# Patient Record
Sex: Male | Born: 1980 | Race: Black or African American | Hispanic: No | Marital: Single | Smoking: Never smoker
Health system: Southern US, Community
[De-identification: ages and names within clinical notes are randomized; demographics above are authoritative.]

## PROBLEM LIST (undated history)

## (undated) HISTORY — PX: INNER EAR SURGERY: SHX679

---

## 2005-04-25 ENCOUNTER — Emergency Department: Payer: Self-pay | Admitting: Emergency Medicine

## 2005-05-02 ENCOUNTER — Emergency Department: Payer: Self-pay | Admitting: Unknown Physician Specialty

## 2006-05-05 ENCOUNTER — Emergency Department: Payer: Self-pay | Admitting: Emergency Medicine

## 2006-05-18 ENCOUNTER — Emergency Department: Payer: Self-pay | Admitting: Emergency Medicine

## 2007-12-06 ENCOUNTER — Emergency Department: Payer: Self-pay | Admitting: Emergency Medicine

## 2008-02-01 ENCOUNTER — Emergency Department: Payer: Self-pay | Admitting: Emergency Medicine

## 2008-02-01 ENCOUNTER — Other Ambulatory Visit: Payer: Self-pay

## 2013-04-20 ENCOUNTER — Ambulatory Visit: Payer: Self-pay | Admitting: Otolaryngology

## 2014-09-22 NOTE — Op Note (Signed)
PATIENT NAME:  Tyrone Price, Tyrone Price MR#:  [id number] DATE OF BIRTH:  1980/10/16  DATE OF PROCEDURE:  04/20/2013  PREOPERATIVE DIAGNOSIS: Left tympanic membrane perforation.   POSTOPERATIVE DIAGNOSIS: Left tympanic membrane perforation.  PROCEDURE: Left tympanoplasty.   SURGEON: Marion DownerScott Kamisha Ell, MD.   ANESTHESIA: General endotracheal.   INDICATIONS: The patient with Price history of anterior left TM perforation, approximately 30% of the TM.   FINDINGS: There was Price 30% anterior perforation with clean margins.   COMPLICATIONS: None.   DESCRIPTION OF PROCEDURE: After obtaining informed consent, the patient was taken to the operating room and placed in the supine position. After induction of general endotracheal anesthesia, the patient was turned 90 degrees and the left ear was evaluated under the operating microscope and injected with 1% lidocaine with epinephrine 1:200,000 in the canal skin as well as injections in the postauricular crease. The left ear was then prepped and draped in the usual sterile fashion. The ear was evaluated under the operating microscope. Canal incisions were made at 12:00 and 6:00 vertically followed by Price horizontal canal incision 4 mm behind the annulus. Price postauricular incision was then made and carried down to the mastoid cortex. The temporalis fascia was dissected out and harvested as Price graft. This was set aside to be pressed and dried. Price Lempert was used to elevate the periosteum from the mastoid cortex, elevating down into the posterior ear canal. Price freer elevator was used to further elevate the posterior canal wall skin flap down to the previously made canal cuts. The ear was then retracted forward with Price Penrose drain and self retainer. Price weapon was used to elevate the tympanomeatal flap down to the annulus after which Price Rosen needle was used to carefully elevate the annulus and enter the middle ear. The chorda tympani nerve was identified and carefully preserved as the  tympanomeatal flap was elevated. The perforation was anterior. The margin of the perforation was carefully debrided using Price pick and baby weapon removing the epithelial edge circumferentially.   The middle ear was then irrigated and suctioned and the temporalis fascia graft was trimmed with Price notch superiorly for the malleus and then placed into the middle ear. The middle ear was then packed with Gelfoam to support the graft against the tympanic membrane remnant. Care was taken to make sure the tympanic membrane perforation was completely covered by the medially placed graft at all edges. The graft and tympanomeatal flap were placed into position and Gelfoam packed lateral to the reconstructed tympanic membrane. The posterior canal flap was everted and then carefully placed into anatomic position and the canal further packed with Gelfoam, which was moistened with sulfacetamide drops. The postauricular incision was closed with 4-0 Vicryl sutures in an interrupted fashion followed by 5-0 fast absorbing gut suture and Price running lock stitch for the skin. Bacitracin ointment was applied on the wound as well as in the canal followed by placement of Price Glasscock dressing. The patient was then returned to the anesthesiologist for awakening. He was awakened and taken to the recovery room in good condition postoperatively. Blood loss was less than 5 mL.   ____________________________ Ollen GrossPaul S. Willeen CassBennett, MD psb:aw D: 04/20/2013 13:30:52 ET T: 04/20/2013 14:24:57 ET JOB#: [id number]  cc: Ollen GrossPaul S. Willeen CassBennett, MD, <Dictator> Sandi MealyPAUL S Lawrie Tunks MD ELECTRONICALLY SIGNED 04/26/2013 12:00

## 2015-08-04 ENCOUNTER — Encounter: Payer: Self-pay | Admitting: Gynecology

## 2015-08-04 ENCOUNTER — Ambulatory Visit
Admission: EM | Admit: 2015-08-04 | Discharge: 2015-08-04 | Disposition: A | Discharge status: Home / Self Care | Payer: BLUE CROSS/BLUE SHIELD | Attending: Family Medicine | Admitting: Family Medicine

## 2015-08-04 DIAGNOSIS — J0100 Acute maxillary sinusitis, unspecified: Secondary | ICD-10-CM | POA: Diagnosis not present

## 2015-08-04 DIAGNOSIS — J101 Influenza due to other identified influenza virus with other respiratory manifestations: Secondary | ICD-10-CM | POA: Diagnosis not present

## 2015-08-04 LAB — RAPID INFLUENZA A&B ANTIGENS
Influenza A (ARMC): NEGATIVE
Influenza B (ARMC): POSITIVE — AB

## 2015-08-04 LAB — RAPID STREP SCREEN (MED CTR MEBANE ONLY): Streptococcus, Group A Screen (Direct): NEGATIVE

## 2015-08-04 MED ORDER — FLUTICASONE PROPIONATE 50 MCG/ACT NA SUSP: 2.0000 | Freq: Every day | NASAL | Status: DC

## 2015-08-04 MED ORDER — AZITHROMYCIN 250 MG PO TABS: ORAL_TABLET | ORAL | Status: DC

## 2015-08-04 MED ORDER — OSELTAMIVIR PHOSPHATE 75 MG PO CAPS: 75.0000 mg | ORAL_CAPSULE | Freq: Two times a day (BID) | ORAL | Status: DC

## 2015-08-04 MED ORDER — HYDROCOD POLST-CPM POLST ER 10-8 MG/5ML PO SUER: 5.0000 mL | Freq: Two times a day (BID) | ORAL | Status: DC

## 2015-08-04 NOTE — ED Notes (Signed)
Patient c/o x 1 week coughing nasal congestion / head ace and sore throat.

## 2015-08-04 NOTE — Discharge Instructions (Signed)
Cool Mist Vaporizers Vaporizers may help relieve the symptoms of a cough and cold. They add moisture to the air, which helps mucus to become thinner and less sticky. This makes it easier to breathe and cough up secretions. Cool mist vaporizers do not cause serious burns like hot mist vaporizers, which may also be called steamers or humidifiers. Vaporizers have not been proven to help with colds. You should not use a vaporizer if you are allergic to mold. HOME CARE INSTRUCTIONS  Follow the package instructions for the vaporizer.  Do not use anything other than distilled water in the vaporizer.  Do not run the vaporizer all of the time. This can cause mold or bacteria to grow in the vaporizer.  Clean the vaporizer after each time it is used.  Clean and dry the vaporizer well before storing it.  Stop using the vaporizer if worsening respiratory symptoms develop.   This information is not intended to replace advice given to you by your health care provider. Make sure you discuss any questions you have with your health care provider.   Document Released: 02/14/2004 Document Revised: 05/24/2013 Document Reviewed: 10/06/2012 Elsevier Interactive Patient Education 2016 Else[employer] Inc.  Influenza, Adult Influenza ("the flu") is a viral infection of the respiratory tract. It occurs more often in winter months because people spend more time in close contact with one another. Influenza can make you feel very sick. Influenza easily spreads from person to person (contagious). CAUSES  Influenza is caused by a virus that infects the respiratory tract. You can catch the virus by breathing in droplets from an infected person's cough or sneeze. You can also catch the virus by touching something that was recently contaminated with the virus and then touching your mouth, nose, or eyes. RISKS AND COMPLICATIONS You may be at risk for a more severe case of influenza if you smoke cigarettes, have diabetes, have  chronic heart disease (such as heart failure) or lung disease (such as asthma), or if you have a weakened immune system. Elderly people and pregnant women are also at risk for more serious infections. The most common problem of influenza is a lung infection (pneumonia). Sometimes, this problem can require emergency medical care and may be life threatening. SIGNS AND SYMPTOMS  Symptoms typically last 4 to 10 days and may include:  Fever.  Chills.  Headache, body aches, and muscle aches.  Sore throat.  Chest discomfort and cough.  Poor appetite.  Weakness or feeling tired.  Dizziness.  Nausea or vomiting. DIAGNOSIS  Diagnosis of influenza is often made based on your history and a physical exam. A nose or throat swab test can be done to confirm the diagnosis. TREATMENT  In mild cases, influenza goes away on its own. Treatment is directed at relieving symptoms. For more severe cases, your health care provider may prescribe antiviral medicines to shorten the sickness. Antibiotic medicines are not effective because the infection is caused by a virus, not by bacteria. HOME CARE INSTRUCTIONS  Take medicines only as directed by your health care provider.  Use a cool mist humidifier to make breathing easier.  Get plenty of rest until your temperature returns to normal. This usually takes 3 to 4 days.  Drink enough fluid to keep your urine clear or pale yellow.  Cover yourmouth and nosewhen coughing or sneezing,and wash your handswellto prevent thevirusfrom spreading.  Stay homefromwork orschool untilthe fever is gonefor at least 60full day. PREVENTION  An annual influenza vaccination (flu shot) is the best  way to avoid getting influenza. An annual flu shot is now routinely recommended for all adults in the [place] SEEK MEDICAL CARE IF:  You experiencechest pain, yourcough worsens,or you producemore mucus.  Youhave nausea,vomiting, ordiarrhea.  Your fever returns or  gets worse. SEEK IMMEDIATE MEDICAL CARE IF:  You havetrouble breathing, you become short of breath,or your skin ornails becomebluish.  You have severe painor stiffnessin the neck.  You develop a sudden headache, or pain in the face or ear.  You have nausea or vomiting that you cannot control. MAKE SURE YOU:   Understand these instructions.  Will watch your condition.  Will get help right away if you are not doing well or get worse.   This information is not intended to replace advice given to you by your health care provider. Make sure you discuss any questions you have with your health care provider.   Document Released: 05/16/2000 Document Revised: 06/09/2014 Document Reviewed: 08/18/2011 Elsevier Interactive Patient Education 2016 Elsevier Inc.  Sinusitis, Adult Sinusitis is redness, soreness, and inflammation of the paranasal sinuses. Paranasal sinuses are air pockets within the bones of your face. They are located beneath your eyes, in the middle of your forehead, and above your eyes. In healthy paranasal sinuses, mucus is able to drain out, and air is able to circulate through them by way of your nose. However, when your paranasal sinuses are inflamed, mucus and air can become trapped. This can allow bacteria and other germs to grow and cause infection. Sinusitis can develop quickly and last only a short time (acute) or continue over a long period (chronic). Sinusitis that lasts for more than 12 weeks is considered chronic. CAUSES Causes of sinusitis include:  Allergies.  Structural abnormalities, such as displacement of the cartilage that separates your nostrils (deviated septum), which can decrease the air flow through your nose and sinuses and affect sinus drainage.  Functional abnormalities, such as when the small hairs (cilia) that line your sinuses and help remove mucus do not work properly or are not present. SIGNS AND SYMPTOMS Symptoms of acute and chronic  sinusitis are the same. The primary symptoms are pain and pressure around the affected sinuses. Other symptoms include:  Upper toothache.  Earache.  Headache.  Bad breath.  Decreased sense of smell and taste.  A cough, which worsens when you are lying flat.  Fatigue.  Fever.  Thick drainage from your nose, which often is green and may contain pus (purulent).  Swelling and warmth over the affected sinuses. DIAGNOSIS Your health care provider will perform a physical exam. During your exam, your health care provider may perform any of the following to help determine if you have acute sinusitis or chronic sinusitis:  Look in your nose for signs of abnormal growths in your nostrils (nasal polyps).  Tap over the affected sinus to check for signs of infection.  View the inside of your sinuses using an imaging device that has a light attached (endoscope). If your health care provider suspects that you have chronic sinusitis, one or more of the following tests may be recommended:  Allergy tests.  Nasal culture. A sample of mucus is taken from your nose, sent to a lab, and screened for bacteria.  Nasal cytology. A sample of mucus is taken from your nose and examined by your health care provider to determine if your sinusitis is related to an allergy. TREATMENT Most cases of acute sinusitis are related to a viral infection and will resolve on their own  within 10 days. Sometimes, medicines are prescribed to help relieve symptoms of both acute and chronic sinusitis. These may include pain medicines, decongestants, nasal steroid sprays, or saline sprays. However, for sinusitis related to a bacterial infection, your health care provider will prescribe antibiotic medicines. These are medicines that will help kill the bacteria causing the infection. Rarely, sinusitis is caused by a fungal infection. In these cases, your health care provider will prescribe antifungal medicine. For some cases of  chronic sinusitis, surgery is needed. Generally, these are cases in which sinusitis recurs more than 3 times per year, despite other treatments. HOME CARE INSTRUCTIONS  Drink plenty of water. Water helps thin the mucus so your sinuses can drain more easily.  Use a humidifier.  Inhale steam 3-4 times a day (for example, sit in the bathroom with the shower running).  Apply a warm, moist washcloth to your face 3-4 times a day, or as directed by your health care provider.  Use saline nasal sprays to help moisten and clean your sinuses.  Take medicines only as directed by your health care provider.  If you were prescribed either an antibiotic or antifungal medicine, finish it all even if you start to feel better. SEEK IMMEDIATE MEDICAL CARE IF:  You have increasing pain or severe headaches.  You have nausea, vomiting, or drowsiness.  You have swelling around your face.  You have vision problems.  You have a stiff neck.  You have difficulty breathing.   This information is not intended to replace advice given to you by your health care provider. Make sure you discuss any questions you have with your health care provider.   Document Released: 05/19/2005 Document Revised: 06/09/2014 Document Reviewed: 06/03/2011 Elsevier Interactive Patient Education [employer] Elsevier Inc.

## 2015-08-04 NOTE — ED Provider Notes (Signed)
CSN: [id number]     Arrival date & time 08/04/15  1451 History   First MD Initiated Contact with Patient 08/04/15 1521     Chief Complaint  Patient presents with  . URI   (Consider location/radiation/quality/duration/timing/severity/associated sxs/prior Treatment) HPI  This a 35 year old male who presents with a one-week history of coughing and nasal can do along with headache and sore throat. He states that for the last couple days it has been worsening. His headache is mostly in his frontal and maxillary sinuses. He has had some yellow mucus discharge from his nose and has been coughing but that tends to swallow the sputum up. His girlfriend who accompanies him states that today he does cough all night keeping her awake. He has some nasal congestion along with the runny nose. He has had the chills and is febrile today at 99 pulse of 89 respirations 18 blood pressure 111/69 O2 sat 98% on room air  History reviewed. No pertinent past medical history. Past Surgical History  Procedure Laterality Date  . Inner ear surgery Left    Family History  Problem Relation Age of Onset  . Hypertension Mother   . Diabetes Father    Social History  Substance Use Topics  . Smoking status: Former Games developer  . Smokeless tobacco: Never Used  . Alcohol Use: No    Review of Systems  Constitutional: Positive for fever, chills, activity change and fatigue.  HENT: Positive for congestion, postnasal drip, rhinorrhea, sinus pressure and sneezing.   Respiratory: Positive for cough. Negative for shortness of breath, wheezing and stridor.   All other systems reviewed and are negative.   Allergies  Ciprofloxacin  Home Medications   Prior to Admission medications   Medication Sig Start Date End Date Taking? Authorizing Provider  azithromycin (ZITHROMAX Z-PAK) 250 MG tablet Use as per package instructions 08/04/15   Lutricia Feil, PA-C  chlorpheniramine-HYDROcodone [facility]) 10-8 MG/5ML SUER  Take 5 mLs by mouth 2 (two) times daily. 08/04/15   Lutricia Feil, PA-C  fluticasone (FLONASE) 50 MCG/ACT nasal spray Place 2 sprays into both nostrils daily. 08/04/15   Lutricia Feil, PA-C  oseltamivir (TAMIFLU) 75 MG capsule Take 1 capsule (75 mg total) by mouth every 12 (twelve) hours. 08/04/15   Lutricia Feil, PA-C   Meds Ordered and Administered this Visit  Medications - No data to display  BP 111/69 mmHg  Pulse 89  Temp(Src) 99 F (37.2 C) (Oral)  Resp 18  Ht 6' (1.829 m)  Wt 206 lb (93.441 kg)  BMI 27.93 kg/m2  SpO2 98% No data found.   Physical Exam  Constitutional: He is oriented to person, place, and time. He appears well-developed and well-nourished. No distress.  HENT:  Head: Normocephalic and atraumatic.  Right Ear: External ear normal.  Left Ear: External ear normal.  Nose: Nose normal.  Mouth/Throat: Oropharynx is clear and moist. No oropharyngeal exudate.  Eyes: Conjunctivae are normal. Pupils are equal, round, and reactive to light. Right eye exhibits no discharge. Left eye exhibits no discharge.  Neck: Normal range of motion. Neck supple.  Pulmonary/Chest: Effort normal and breath sounds normal. No respiratory distress. He has no wheezes. He has no rales.  Musculoskeletal: Normal range of motion. He exhibits no edema or tenderness.  Lymphadenopathy:    He has no cervical adenopathy.  Neurological: He is alert and oriented to person, place, and time.  Skin: Skin is warm and dry. No rash noted. He is not diaphoretic.  Psychiatric: He has a normal mood and affect. His behavior is normal. Judgment and thought content normal.  Nursing note and vitals reviewed.   ED Course  Procedures (including critical care time)  Labs Review Labs Reviewed  RAPID INFLUENZA A&B ANTIGENS ([facility] ONLY) - Abnormal; Notable for the following:    Influenza B ([facility]) POSITIVE (*)    All other components within normal limits  RAPID STRE[facility]EN [facility])  CULTURE, GROUP A  STREP [facility]    Imaging Review No results found.   Visual Acuity Review  Right Eye Distance:   Left Eye Distance:   Bilateral Distance:    Right Eye Near:   Left Eye Near:    Bilateral Near:         MDM   1. Influenza B   2. Acute maxillary sinusitis, recurrence not specified    New Prescriptions   AZITHROMYCIN (ZITHROMAX Z-PAK) 250 MG TABLET    Use as per package instructions   CHLORPHENIRAMINE-HYDROCODONE (TUSSIONEX PENNKINETIC ER) 10-8 MG/5ML SUER    Take 5 mLs by mouth 2 (two) times daily.   FLUTICASONE (FLONASE) 50 MCG/ACT NASAL SPRAY    Place 2 sprays into both nostrils daily.   OSELTAMIVIR (TAMIFLU) 75 MG CAPSULE    Take 1 capsule (75 mg total) by mouth every 12 (twelve) hours.  Plan: 1. Test/x-ray results and diagnosis reviewed with patient 2. rx as per orders; risks, benefits, potential side effects reviewed with patient 3. Recommend supportive treatment with Tamiflu if it isn't too expensive on his insurance plan. Told him it may help him feel better quicker and decrease the amount of time without it he would do just S course as is. He also likely has a sinusitis that is developing and INR but told to hold onto Z-Pak and use if he continues to have his symptoms for 1 week more. I will keep him out of work through Tuesday any return on Tuesday if his fever is broken by then. He should follow-up with a primary care if he continues to have problems worsens  Lutricia FeilWilliam P Roemer, PA-C 08/04/15 1547

## 2015-08-06 LAB — CULTURE, GROUP A STREP (THRC)

## 2016-02-12 ENCOUNTER — Emergency Department: Payer: BLUE CROSS/BLUE SHIELD

## 2016-02-12 ENCOUNTER — Encounter: Payer: Self-pay | Admitting: Emergency Medicine

## 2016-02-12 ENCOUNTER — Emergency Department
Admission: EM | Admit: 2016-02-12 | Discharge: 2016-02-12 | Disposition: A | Discharge status: Home / Self Care | Payer: BLUE CROSS/BLUE SHIELD | Attending: Emergency Medicine | Admitting: Emergency Medicine

## 2016-02-12 DIAGNOSIS — R0789 Other chest pain: Secondary | ICD-10-CM | POA: Diagnosis not present

## 2016-02-12 DIAGNOSIS — Z87891 Personal history of nicotine dependence: Secondary | ICD-10-CM | POA: Insufficient documentation

## 2016-02-12 DIAGNOSIS — Z792 Long term (current) use of antibiotics: Secondary | ICD-10-CM | POA: Insufficient documentation

## 2016-02-12 DIAGNOSIS — R079 Chest pain, unspecified: Secondary | ICD-10-CM

## 2016-02-12 LAB — TROPONIN I: Troponin I: <0.03 ng/mL (ref <0.03)

## 2016-02-12 LAB — CBC
HEMATOCRIT: 43.6 % (ref 40.0–52.0)
HEMOGLOBIN: 14.8 g/dL (ref 13.0–18.0)
MCH: 29.7 pg (ref 26.0–34.0)
MCHC: 33.9 g/dL (ref 32.0–36.0)
MCV: 87.7 fL (ref 80.0–100.0)
Platelets: 210 10*3/uL (ref 150–440)
RBC: 4.97 MIL/uL (ref 4.40–5.90)
RDW: 12.8 % (ref 11.5–14.5)
WBC: 5.6 10*3/uL (ref 3.8–10.6)

## 2016-02-12 LAB — BASIC METABOLIC PANEL
ANION GAP: 6 (ref 5–15)
BUN: 11 mg/dL (ref 6–20)
CHLORIDE: 103 mmol/L (ref 101–111)
CO2: 28 mmol/L (ref 22–32)
Calcium: 9.5 mg/dL (ref 8.9–10.3)
Creatinine, Ser: 0.99 mg/dL (ref 0.61–1.24)
GFR calc Af Amer: >60 mL/min (ref >60)
GFR calc non Af Amer: >60 mL/min (ref >60)
Glucose, Bld: 89 mg/dL (ref 65–99)
POTASSIUM: 4.1 mmol/L (ref 3.5–5.1)
SODIUM: 137 mmol/L (ref 135–145)

## 2016-02-12 MED ORDER — GI COCKTAIL ~~LOC~~
30.0000 mL | Freq: Once | ORAL | Status: AC
  Administered 2016-02-12: 30 mL via ORAL
  Filled 2016-02-12: qty 30

## 2016-02-12 MED ORDER — PANTOPRAZOLE SODIUM 20 MG PO TBEC: 20.0000 mg | DELAYED_RELEASE_TABLET | Freq: Every day | ORAL | 1 refills | Status: DC

## 2016-02-12 NOTE — ED Triage Notes (Signed)
C/O chest pain x 1 week.  Describes pain as intermittent.  At times pain is to right chest, at times to center. States pain improves with ambulation

## 2016-02-12 NOTE — Discharge Instructions (Signed)
Please return immediately if condition worsens. Please contact her primary physician or the physician you were given for referral. If you have any specialist physicians involved in her treatment and plan please also contact them. Thank you for using [facility] [place] [facility]. ° °

## 2016-02-12 NOTE — ED Provider Notes (Signed)
Time Seen: Approximately 1754  I have reviewed the triage notes  Chief Complaint: No chief complaint on file.   History of Present Illness: Tyrone Price is a 35 y.o. male who presents with some intermittent chest discomfort now for the past week. Patient states he's pretty much had constant discomfort over the last 24 hours so will change in location. He states his previously been on the left side of his chest pain occasionally substernal and then sometimes toward the right upper chest region. He states the pain primarily started to the epigastric area which is where he points and he states he's had a lot of burping and belching and the pain seems to be worse with lying flat. He denies any persistent nausea or vomiting or shortness of breath. He denies any pleuritic or obvious positional component. He denies any leg pain or swelling and has no history of blood clots in the legs or lungs.  He denies any cardiac risk factors such as history diabetes, hypertension, high cholesterol. No family history of early cardiovascular disease History reviewed. No pertinent past medical history.  There are no active problems to display for this patient.   Past Surgical History:  Procedure Laterality Date  . INNER EAR SURGERY Left     Past Surgical History:  Procedure Laterality Date  . INNER EAR SURGERY Left     Current Outpatient Rx  . Order #: [id number] Class: Normal  . Order #: [id number] Class: Print  . Order #: [id number] Class: Normal  . Order #: [id number] Class: Print  . Order #: [id number] Class: Print    Allergies:  Ciprofloxacin  Family History: Family History  Problem Relation Age of Onset  . Hypertension Mother   . Diabetes Father     Social History: Social History  Substance Use Topics  . Smoking status: Former Games developermoker  . Smokeless tobacco: Never Used  . Alcohol use No     Review of Systems:   10 point review of systems was performed and was otherwise  negative:  Constitutional: No fever Eyes: No visual disturbances ENT: No sore throat, ear pain Cardiac: No chest pain Respiratory: No shortness of breath, wheezing, or stridor Abdomen: No abdominal pain, no vomiting, No diarrhea Endocrine: No weight loss, No night sweats Extremities: No peripheral edema, cyanosis Skin: No rashes, easy bruising Neurologic: No focal weakness, trouble with speech or swollowing Urologic: No dysuria, Hematuria, or urinary frequency   Physical Exam:  ED Triage Vitals  Enc Vitals Group     BP 02/12/16 1734 134/70     Pulse Rate 02/12/16 1734 70     Resp 02/12/16 1734 16     Temp 02/12/16 1734 98.6 F (37 C)     Temp Source 02/12/16 1734 Oral     SpO2 02/12/16 1734 98 %     Weight 02/12/16 1733 203 lb (92.1 kg)     Height 02/12/16 1733 6' (1.829 m)     Head Circumference --      Peak Flow --      Pain Score 02/12/16 1733 3     Pain Loc --      Pain Edu? --      Excl. in GC? --     General: Awake , Alert , and Oriented times 3; GCS 15 Head: Normal cephalic , atraumatic Eyes: Pupils equal , round, reactive to light Nose/Throat: No nasal drainage, patent upper airway without erythema or exudate.  Neck: Supple, Full range of motion, No anterior adenopathy or palpable  thyroid masses Lungs: Clear to ascultation without wheezes , rhonchi, or rales Heart: Regular rate, regular rhythm without murmurs , gallops , or rubs Abdomen: Soft, non tender without rebound, guarding , or rigidity; bowel sounds positive and symmetric in all 4 quadrants. No organomegaly .        Extremities: 2 plus symmetric pulses. No edema, clubbing or cyanosis Neurologic: normal ambulation, Motor symmetric without deficits, sensory intact Skin: warm, dry, no rashes   Labs:   All laboratory work was reviewed including any pertinent negatives or positives listed below:  Labs Reviewed  BASIC METABOLIC PANEL  CBC  TROPONIN I  Laboratory work was reviewed and showed no  clinically significant abnormalities.   EKG: ED ECG REPORT I, Jennye Moccasin, the attending physician, personally viewed and interpreted this ECG.  Date: 02/12/2016 EKG Time: 1737 Rate: *73 Rhythm: normal sinus rhythm QRS Axis: normal Intervals: normal ST/T Wave abnormalities: normal Conduction Disturbances: none Narrative Interpretation: unremarkable Early repolarization with J-point elevation, no acute ischemic changes are noted  Radiology:  "Dg Chest 2 View  Result Date: 02/12/2016 CLINICAL DATA:  Chest pain for 1 week. Intermittent pain, central and right sided. EXAM: CHEST  2 VIEW COMPARISON:  02/01/2008 FINDINGS: The cardiomediastinal contours are normal. The lungs are clear. Pulmonary vasculature is normal. No consolidation, pleural effusion, or pneumothorax. No acute osseous abnormalities are seen. IMPRESSION: No acute pulmonary process. Electronically Signed   By: Rubye Oaks M.D.   On: 02/12/2016 18:09  "  I personally reviewed the radiologic studies     ED Course:  Differential includes all life-threatening causes for chest pain. This includes but is not exclusive to acute coronary syndrome, aortic dissection, pulmonary embolism, cardiac tamponade, community-acquired pneumonia, pericarditis, musculoskeletal chest wall pain, etc.  Given the patient's current clinical presentation, was factors, etc. I felt this was unlikely to be acute coronary syndrome or other life-threatening causes for chest pain. GI cocktail with symptomatic improvement and given his descriptions of felt this is likely to be esophageal reflux discomfort. Clinical Course     Assessment: Acute unspecified chest pain   Final Clinical Impression  Final diagnoses:  Chest pain syndrome     Plan: Outpatient Patient was referred to [facility] family practice. " New Prescriptions   PANTOPRAZOLE (PROTONIX) 20 MG TABLET    Take 1 tablet (20 mg total) by mouth daily.  "  Patient was  advised to return immediately if condition worsens. Patient was advised to follow up with their primary care physician or other specialized physicians involved in their outpatient care. The patient and/or family member/power of attorney had laboratory results reviewed at the bedside. All questions and concerns were addressed and appropriate discharge instructions were distributed by the nursing staff.            Jennye Moccasin, MD 02/12/16 [phone]

## 2017-12-25 IMAGING — CR DG CHEST 2V
1 series · 2 of 2 positions shown · non-contrast
Comparison: 02/01/2008

CLINICAL DATA: Chest pain for 1 week. Intermittent pain, central
and right sided.

EXAM:
CHEST  2 VIEW

[Series 1: dg chest 2 view · 0.14mm/px · 2 of 2 slices shown]
[im 1/2]
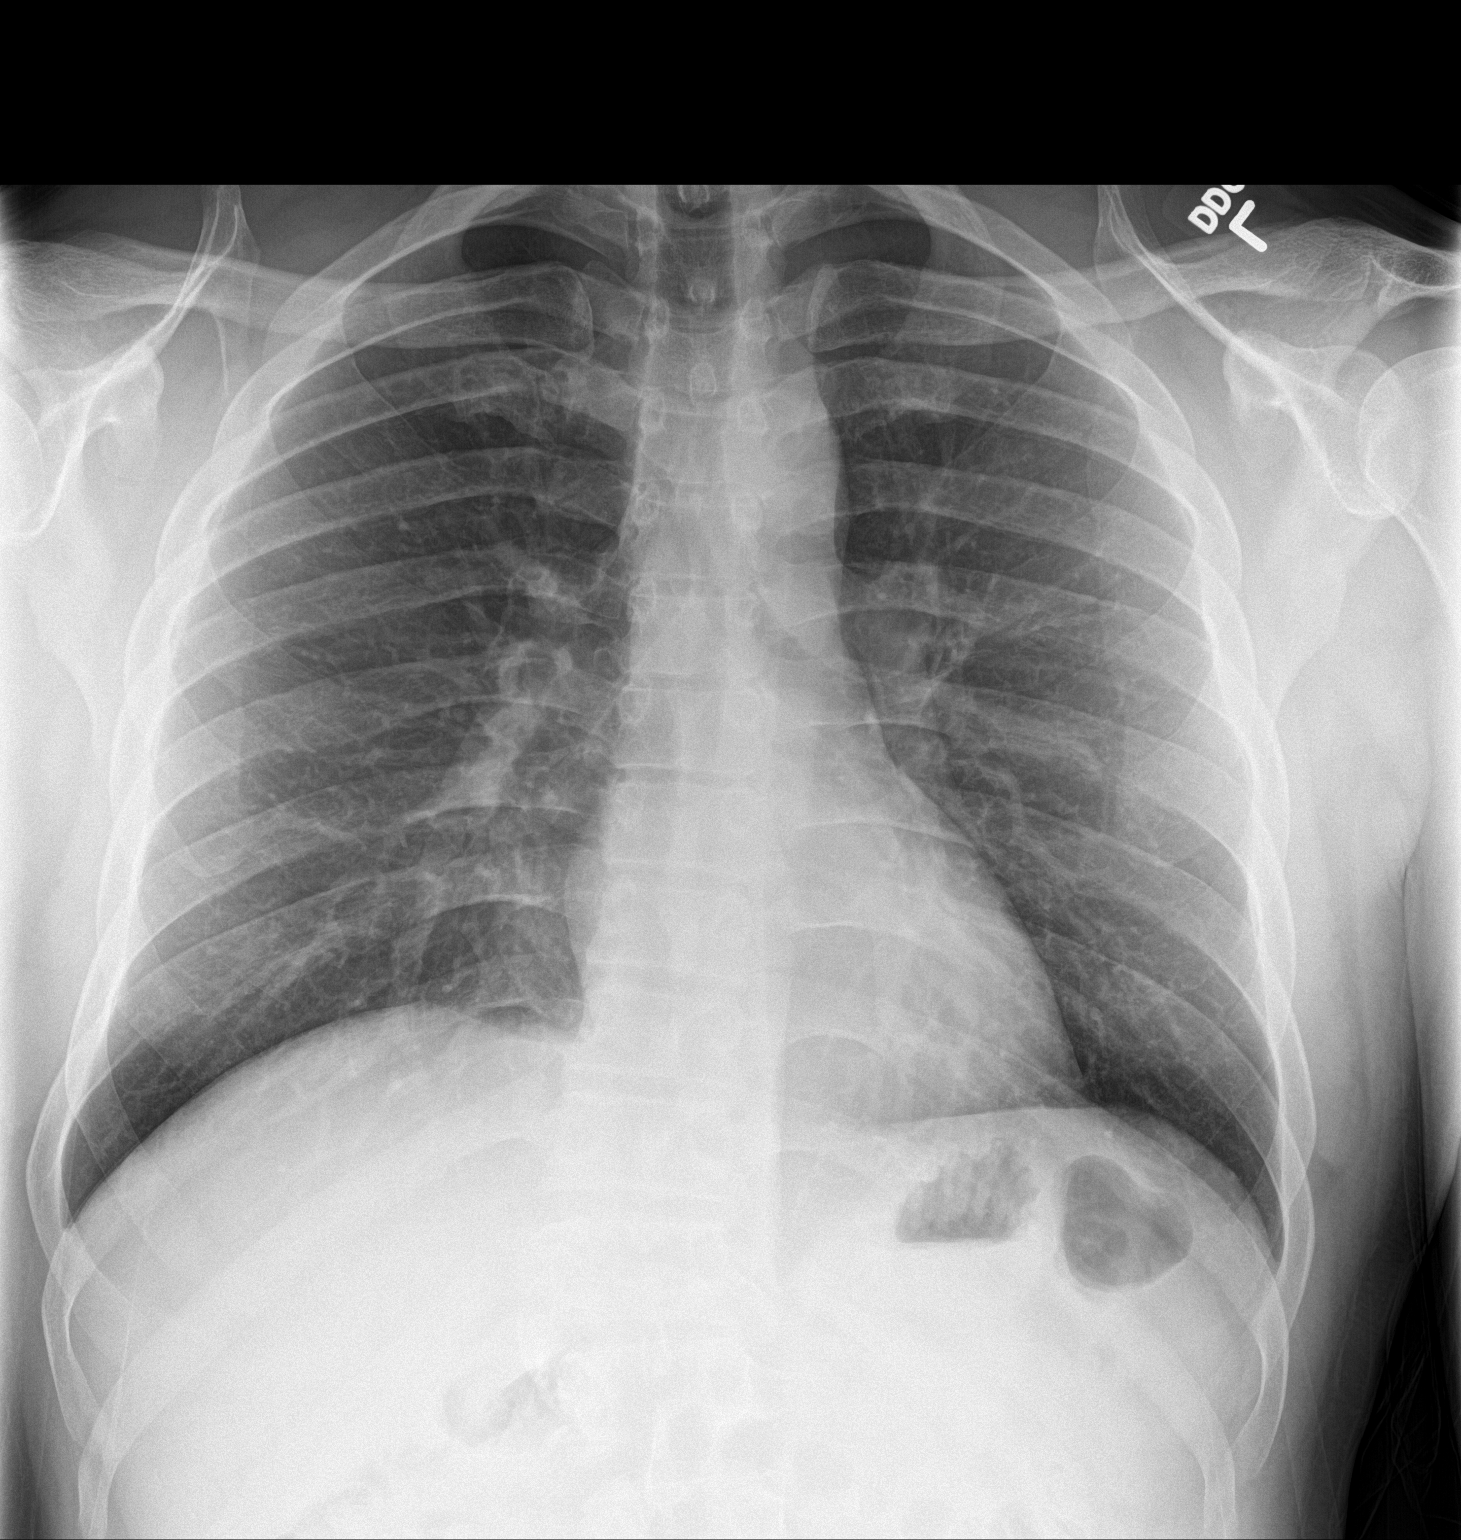
[im 2/2]
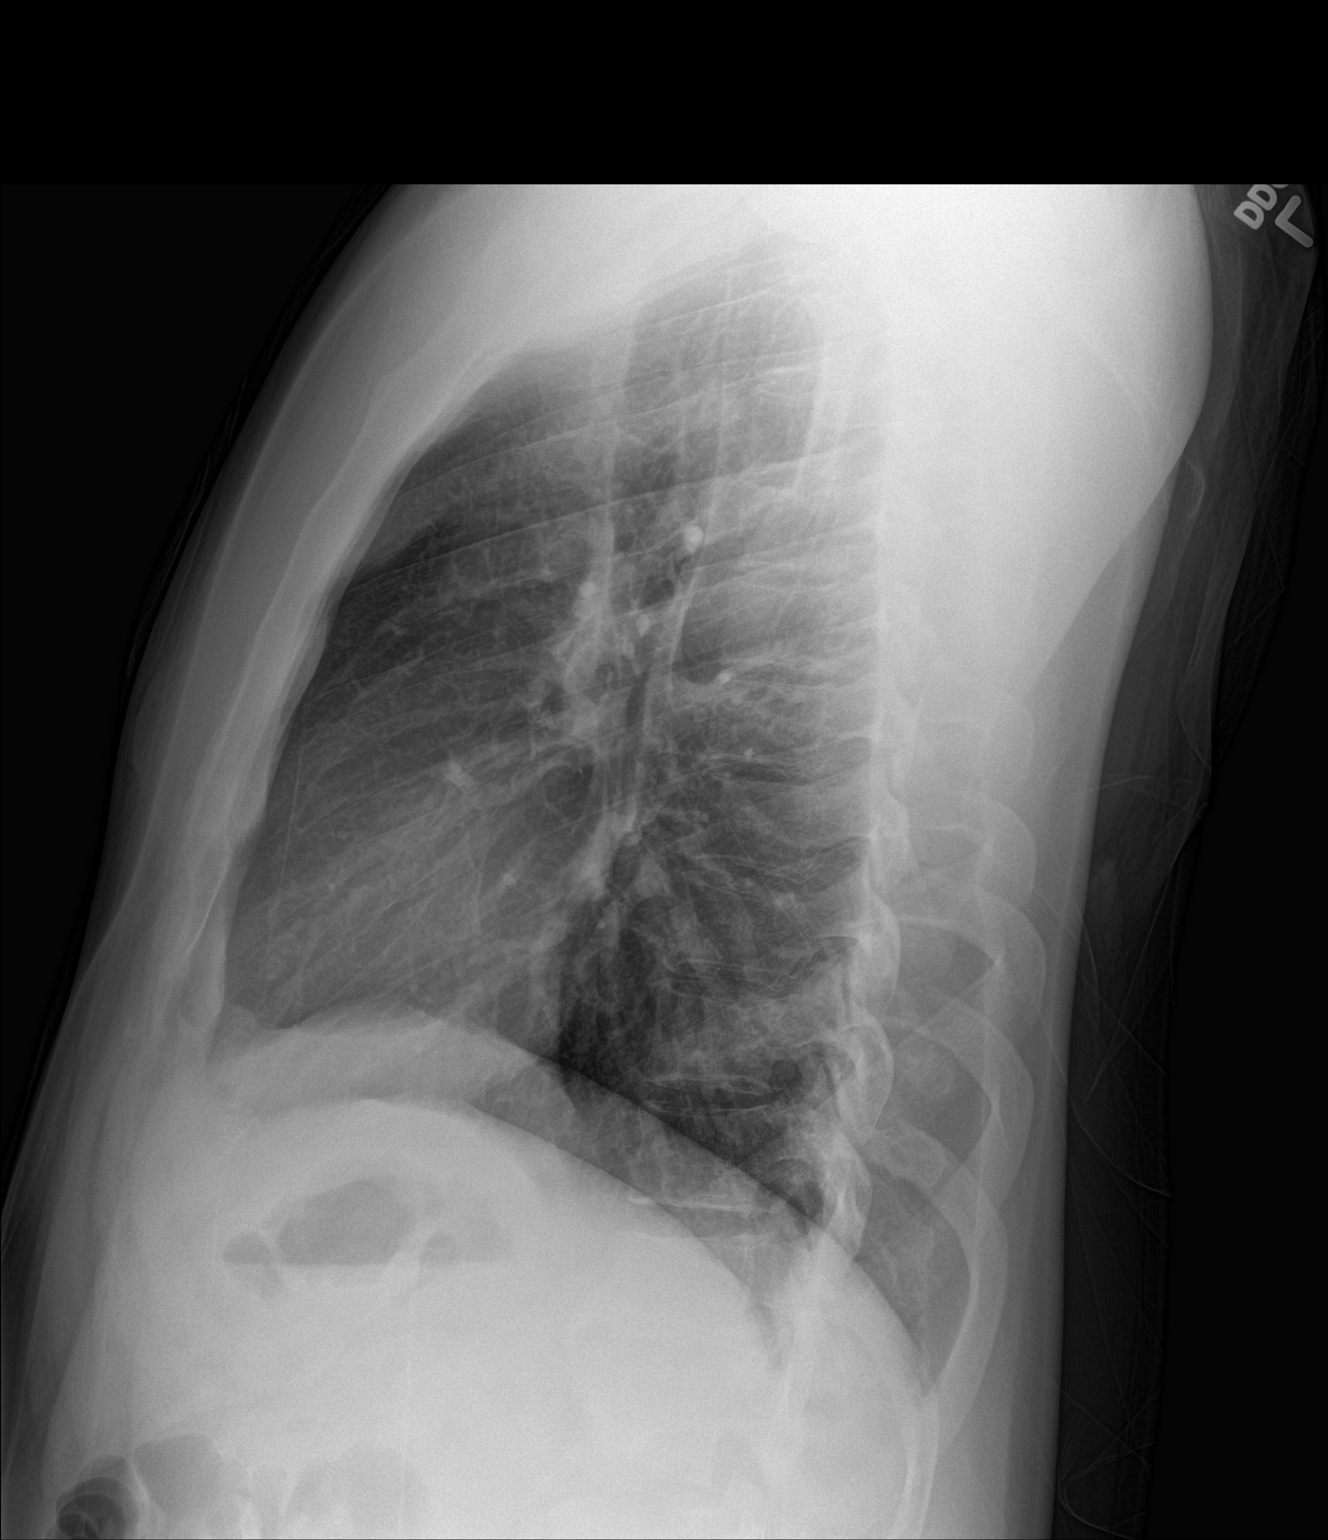

[2 of 2 positions shown; findings below may reference images not displayed]

FINDINGS: The cardiomediastinal contours are normal. The lungs are clear.
Pulmonary vasculature is normal. No consolidation, pleural effusion,
or pneumothorax. No acute osseous abnormalities are seen.
IMPRESSION: No acute pulmonary process.

## 2019-11-07 ENCOUNTER — Other Ambulatory Visit: Payer: Self-pay

## 2019-11-07 ENCOUNTER — Ambulatory Visit: Payer: Self-pay | Admitting: Physician Assistant

## 2019-11-07 ENCOUNTER — Encounter: Payer: Self-pay | Admitting: Physician Assistant

## 2019-11-07 DIAGNOSIS — Z113 Encounter for screening for infections with a predominantly sexual mode of transmission: Secondary | ICD-10-CM

## 2019-11-07 LAB — GRAM STAIN

## 2019-11-07 NOTE — Progress Notes (Signed)
   [facility] Department STI clinic/screening visit  Subjective:  Tyrone Price is a 39 y.o. male being seen today for an STI screening visit. The patient reports they do not have symptoms.    Patient has the following medical conditions:  There are no problems to display for this patient.    Chief Complaint  Patient presents with  . SEXUALLY TRANSMITTED DISEASE    screening    HPI  Patient reports that he does not have any symptoms but would like a screening today.  Denies chronic conditions and regular medicines.  States that he has had a surgery to repair his ear drum.  Last HIV testing was about 3 yrs ago per patient.    See flowsheet for further details and programmatic requirements.    The following portions of the patient's history were reviewed and updated as appropriate: allergies, current medications, past medical history, past social history, past surgical history and problem list.  Objective:  There were no vitals filed for this visit.  Physical Exam Constitutional:      General: He is not in acute distress.    Appearance: Normal appearance.  HENT:     Head: Normocephalic and atraumatic.     Mouth/Throat:     Mouth: Mucous membranes are moist.     Pharynx: Oropharynx is clear. No oropharyngeal exudate or posterior oropharyngeal erythema.  Eyes:     Conjunctiva/sclera: Conjunctivae normal.  Pulmonary:     Effort: Pulmonary effort is normal.  Abdominal:     Palpations: Abdomen is soft. There is no mass.     Tenderness: There is no abdominal tenderness. There is no guarding or rebound.  Genitourinary:    Penis: Normal.      Testes: Normal.     Comments: Pubic area without nits, lice, edema, erythema, lesions and inguinal adenopathy. Penis circumcised, without rash, lesions and discharge at meatus. Musculoskeletal:     Cervical back: Neck supple. No tenderness.  Skin:    General: Skin is warm and dry.     Findings: No bruising, erythema,  lesion or rash.  Neurological:     Mental Status: He is alert and oriented to person, place, and time.  Psychiatric:        Mood and Affect: Mood normal.        Behavior: Behavior normal.        Thought Content: Thought content normal.        Judgment: Judgment normal.       Assessment and Plan:  BRANDEN SHALLENBERGER is a 39 y.o. male presenting to the [facility] Department for STI screening  1. Screening for STD (sexually transmitted disease) Patient into clinic without symptoms.  Declines blood work today.  Reviewed Gram stain and exam findings with patient and no treatment indicated today. Rec condoms with all sex. Await test results.  Counseled that RN will call if needs to RTC for treatment once results are back. - Gram stain - Gonococcus culture     No follow-ups on file.  No future appointments.  Matt Holmes, PA

## 2019-11-12 LAB — GONOCOCCUS CULTURE

## 2020-09-18 ENCOUNTER — Ambulatory Visit: Payer: Self-pay

## 2022-06-10 ENCOUNTER — Encounter: Payer: Self-pay | Admitting: Family Medicine

## 2022-06-10 ENCOUNTER — Ambulatory Visit: Payer: Self-pay | Admitting: Family Medicine

## 2022-06-10 DIAGNOSIS — Z113 Encounter for screening for infections with a predominantly sexual mode of transmission: Secondary | ICD-10-CM

## 2022-06-10 LAB — HM HIV SCREENING LAB: HM HIV Screening: NEGATIVE

## 2022-06-10 NOTE — Progress Notes (Signed)
[facility] Department STI clinic/screening visit  Subjective:  Tyrone Price is a 42 y.o. male being seen today for an STI screening visit. The patient reports they do not have symptoms.    Patient has the following medical conditions:  There are no problems to display for this patient.   Chief Complaint  Patient presents with   SEXUALLY TRANSMITTED DISEASE    Screening- patient stated he is not having any symptoms     HPI  Patient reports to clinic for standard STI testing.   Last HIV test per patient/review of record was No results found for: "HMHIVSCREEN" No results found for: "HIV"  Does the patient or their partner desires a pregnancy in the next year? No  Screening for MPX risk: Does the patient have an unexplained rash? No Is the patient MSM? No Does the patient endorse multiple sex partners or anonymous sex partners? No Did the patient have close or sexual contact with a person diagnosed with MPX? No Has the patient traveled outside the [place] where MPX is endemic? No Is there a high clinical suspicion for MPX-- evidenced by one of the following No  -Unlikely to be chickenpox  -Lymphadenopathy  -Rash that present in same phase of evolution on any given body part   See flowsheet for further details and programmatic requirements.   Immunization History  Administered Date(s) Administered   Moderna Sars-Covid-2 Vaccination 12/30/2019, 01/27/2020   Tdap 07/10/2006     The following portions of the patient's history were reviewed and updated as appropriate: allergies, current medications, past medical history, past social history, past surgical history and problem list.  Objective:  There were no vitals filed for this visit.  Physical Exam Vitals and nursing note reviewed.  Constitutional:      Appearance: Normal appearance.  HENT:     Head: Normocephalic and atraumatic.     Mouth/Throat:     Mouth: Mucous membranes are moist.     Pharynx: No  oropharyngeal exudate or posterior oropharyngeal erythema.  Eyes:     General:        Right eye: No discharge.        Left eye: No discharge.     Conjunctiva/sclera:     Right eye: Right conjunctiva is not injected. No exudate.    Left eye: Left conjunctiva is not injected. No exudate. Pulmonary:     Effort: Pulmonary effort is normal.  Abdominal:     General: Abdomen is flat.     Palpations: Abdomen is soft. There is no hepatomegaly or mass.     Tenderness: There is no abdominal tenderness. There is no rebound.  Genitourinary:    Comments: Patient declined genital exam Lymphadenopathy:     Cervical: No cervical adenopathy.     Upper Body:     Right upper body: No supraclavicular or axillary adenopathy.     Left upper body: No supraclavicular or axillary adenopathy.  Skin:    General: Skin is warm and dry.  Neurological:     Mental Status: He is alert and oriented to person, place, and time.    Assessment and Plan:  Tyrone Price is a 42 y.o. male presenting to the [facility] Department for STI screening  1. Screening for venereal disease - HIV [place] [facility] - Syphilis Serology, [facility] - Chlamydia/GC NAA, Confirmation  Patient does not have STI symptoms Patient accepted all screenings including   Patient meets criteria for HepB screening? No. Ordered?  not applicable Patient meets criteria for HepC screening? No. Ordered? not applicable Recommended condom use with all sex Discussed importance of condom use for STI prevent  Treat per standing order Discussed time line for State Lab results and that patient will be called with positive results and encouraged patient to call if he had not heard in 2 weeks Recommended returning for continued or worsening symptoms.   Return if symptoms worsen or fail to improve.  Total time spent 20 minutes.  Lenice Llamas, [place]

## 2022-06-13 LAB — CHLAMYDIA/GC NAA, CONFIRMATION
Chlamydia trachomatis, NAA: NEGATIVE
Neisseria gonorrhoeae, NAA: NEGATIVE

## 2022-06-13 LAB — SPECIMEN STATUS REPORT

## 2023-05-07 ENCOUNTER — Ambulatory Visit: Payer: BC Managed Care – PPO | Admitting: Cardiology

## 2023-05-07 ENCOUNTER — Encounter: Payer: Self-pay | Admitting: Cardiology

## 2023-05-07 VITALS — BP 127/83 | HR 83 | Ht 72.0 in | Wt 214.4 lb

## 2023-05-07 DIAGNOSIS — Z1329 Encounter for screening for other suspected endocrine disorder: Secondary | ICD-10-CM | POA: Diagnosis not present

## 2023-05-07 DIAGNOSIS — Z13220 Encounter for screening for lipoid disorders: Secondary | ICD-10-CM | POA: Diagnosis not present

## 2023-05-07 DIAGNOSIS — Z131 Encounter for screening for diabetes mellitus: Secondary | ICD-10-CM

## 2023-05-07 DIAGNOSIS — Z7689 Persons encountering health services in other specified circumstances: Secondary | ICD-10-CM

## 2023-05-07 DIAGNOSIS — Z0130 Encounter for examination of blood pressure without abnormal findings: Secondary | ICD-10-CM

## 2023-05-07 NOTE — Progress Notes (Signed)
New Patient Office Visit  Subjective    Patient ID: Tyrone Price, male    DOB: Oct 13, 1980  Age: 42 y.o. MRN: [id number]  CC:  Chief Complaint  Patient presents with   Follow-up    HPI Tyrone Price presents to establish care Previous Primary Care provider/office:   he does not have additional concerns to discuss today.   Patient in office to establish care. Patient doing well, no complaints today. Patient fasting, will order lab work. LDL 04/2018 107.     Outpatient Encounter Medications as of 05/07/2023  Medication Sig   [DISCONTINUED] pantoprazole (PROTONIX) 20 MG tablet Take 1 tablet (20 mg total) by mouth daily.   No facility-administered encounter medications on file as of 05/07/2023.    History reviewed. No pertinent past medical history.  Past Surgical History:  Procedure Laterality Date   INNER EAR SURGERY Left     Family History  Problem Relation Age of Onset   Hypertension Mother    Diabetes Father     Social History   Socioeconomic History   Marital status: Single    Spouse name: Not on file   Number of children: Not on file   Years of education: Not on file   Highest education level: Not on file  Occupational History   Not on file  Tobacco Use   Smoking status: Never   Smokeless tobacco: Never  Vaping Use   Vaping status: Never Used  Substance and Sexual Activity   Alcohol use: Yes   Drug use: Never   Sexual activity: Yes  Other Topics Concern   Not on file  Social History Narrative   Not on file   Social Determinants of Health   Financial Resource Strain: Not on file  Food Insecurity: Not on file  Transportation Needs: Not on file  Physical Activity: Not on file  Stress: Not on file  Social Connections: Not on file  Intimate Partner Violence: Not on file    Review of Systems  Constitutional: Negative.   HENT: Negative.    Eyes: Negative.   Respiratory: Negative.  Negative for shortness of breath.   Cardiovascular: Negative.   Negative for chest pain.  Gastrointestinal: Negative.  Negative for abdominal pain, constipation and diarrhea.  Genitourinary: Negative.   Musculoskeletal:  Negative for joint pain and myalgias.  Skin: Negative.   Neurological: Negative.  Negative for dizziness and headaches.  Endo/Heme/Allergies: Negative.   All other systems reviewed and are negative.       Objective    BP 127/83   Pulse 83   Ht 6' (1.829 m)   Wt 214 lb 6.4 oz (97.3 kg)   SpO2 95%   BMI 29.08 kg/m   Physical Exam Nursing note reviewed.  Constitutional:      Appearance: Normal appearance. He is normal weight.  HENT:     Head: Normocephalic and atraumatic.     Nose: Nose normal.     Mouth/Throat:     Mouth: Mucous membranes are moist.     Pharynx: Oropharynx is clear.  Eyes:     Extraocular Movements: Extraocular movements intact.     Conjunctiva/sclera: Conjunctivae normal.     Pupils: Pupils are equal, round, and reactive to light.  Cardiovascular:     Rate and Rhythm: Normal rate and regular rhythm.     Pulses: Normal pulses.     Heart sounds: Normal heart sounds.  Pulmonary:     Effort: Pulmonary effort is normal.  Breath sounds: Normal breath sounds.  Abdominal:     General: Abdomen is flat. Bowel sounds are normal.     Palpations: Abdomen is soft.  Musculoskeletal:        General: Normal range of motion.     Cervical back: Normal range of motion.  Skin:    General: Skin is warm and dry.  Neurological:     General: No focal deficit present.     Mental Status: He is alert and oriented to person, place, and time.  Psychiatric:        Mood and Affect: Mood normal.        Behavior: Behavior normal.        Thought Content: Thought content normal.        Judgment: Judgment normal.        Assessment & Plan:  Fasting lab work today.   Problem List Items Addressed This Visit       Other   Encounter to establish care - Primary   Other Visit Diagnoses     Diabetes mellitus  screening       Relevant Orders   CMP14+EGFR   Hemoglobin A1c   Thyroid disorder screening       Relevant Orders   TSH   Lipid screening       Relevant Orders   Lipid Profile       Return in about 6 months (around 11/05/2023).   Total time spent: 25 minutes  [employer], NP  05/07/2023   This document may have been prepared by Dragon Voice Recognition software and as such may include unintentional dictation errors.

## 2023-05-08 LAB — CMP14+EGFR
ALT: 27 [IU]/L (ref 0–44)
AST: 24 [IU]/L (ref 0–40)
Albumin: 4.5 g/dL (ref 4.1–5.1)
Alkaline Phosphatase: 84 [IU]/L (ref 44–121)
BUN/Creatinine Ratio: 8 — ABNORMAL LOW (ref 9–20)
BUN: 8 mg/dL (ref 6–24)
Bilirubin Total: 0.3 mg/dL (ref 0.0–1.2)
CO2: 25 mmol/L (ref 20–29)
Calcium: 9.8 mg/dL (ref 8.7–10.2)
Chloride: 101 mmol/L (ref 96–106)
Creatinine, Ser: 1.04 mg/dL (ref 0.76–1.27)
Globulin, Total: 2.7 g/dL (ref 1.5–4.5)
Glucose: 98 mg/dL (ref 70–99)
Potassium: 4.7 mmol/L (ref 3.5–5.2)
Sodium: 139 mmol/L (ref 134–144)
Total Protein: 7.2 g/dL (ref 6.0–8.5)
eGFR: 93 mL/min/{1.73_m2} (ref >59)

## 2023-05-08 LAB — LIPID PANEL
Chol/HDL Ratio: 4 {ratio} (ref 0.0–5.0)
Cholesterol, Total: 206 mg/dL — ABNORMAL HIGH (ref 100–199)
HDL: 51 mg/dL (ref >39)
LDL Chol Calc (NIH): 142 mg/dL — ABNORMAL HIGH (ref 0–99)
Triglycerides: 71 mg/dL (ref 0–149)
VLDL Cholesterol Cal: 13 mg/dL (ref 5–40)

## 2023-05-08 LAB — TSH: TSH: 1.61 u[IU]/mL (ref 0.450–4.500)

## 2023-05-08 LAB — HEMOGLOBIN A1C
Est. average glucose Bld gHb Est-mCnc: 131 mg/dL
Hgb A1c MFr Bld: 6.2 % — ABNORMAL HIGH (ref 4.8–5.6)

## 2023-05-11 NOTE — Progress Notes (Signed)
Patient notified

## 2023-09-11 ENCOUNTER — Encounter: Payer: Self-pay | Admitting: Family Medicine

## 2023-09-11 ENCOUNTER — Ambulatory Visit: Payer: Self-pay | Admitting: Family Medicine

## 2023-09-11 DIAGNOSIS — Z113 Encounter for screening for infections with a predominantly sexual mode of transmission: Secondary | ICD-10-CM

## 2023-09-11 NOTE — Progress Notes (Signed)
 [facility] Department STI clinic [address] [place] [address] Main phone: [phone]  STI screening visit  Subjective:  CARON TARDIF is a 43 y.o. male being seen today for an STI screening visit. The patient reports they do not have symptoms.    Patient has the following medical conditions:  Patient Active Problem List   Diagnosis Date Noted   Encounter to establish care 05/07/2023    Chief Complaint  Patient presents with   SEXUALLY TRANSMITTED DISEASE    HPI HPI Patient reports to clinic for STI screening- asymptomatic  STI screening history: Last HIV test per patient/review of record was  Lab Results  Component Value Date   HMHIVSCREEN Negative - Validated 06/10/2022   No results found for: "HIV"  Last HEPC test per patient/review of record was No results found for: "HMHEPCSCREEN" No components found for: "HEPC"   Last HEPB test per patient/review of record was No components found for: "HMHEPBSCREEN"   Fertility: Does the patient or their partner desires a pregnancy in the next year? No  Screening for MPX risk: Does the patient have an unexplained rash? No Is the patient MSM? No Does the patient endorse multiple sex partners or anonymous sex partners? No Did the patient have close or sexual contact with a person diagnosed with MPX? No Has the patient traveled outside the [place] where MPX is endemic? No Is there a high clinical suspicion for MPX-- evidenced by one of the following No  -Unlikely to be chickenpox  -Lymphadenopathy  -Rash that present in same phase of evolution on any given body part   See flowsheet for further details and programmatic requirements.   Immunization History  Administered Date(s) Administered   DTP 11/06/1987   Moderna Sars-Covid-2 Vaccination 12/30/2019, 01/27/2020   OPV 11/06/1987   Tdap 07/10/2006     The following portions of the patient's history were reviewed and updated as  appropriate: allergies, current medications, past medical history, past social history, past surgical history and problem list.  Objective:  There were no vitals filed for this visit.  Physical Exam Vitals and nursing note reviewed.  Constitutional:      Appearance: Normal appearance.  HENT:     Head: Normocephalic and atraumatic.     Mouth/Throat:     Mouth: Mucous membranes are moist.     Pharynx: No oropharyngeal exudate or posterior oropharyngeal erythema.  Eyes:     General:        Right eye: No discharge.        Left eye: No discharge.     Conjunctiva/sclera:     Right eye: Right conjunctiva is not injected. No exudate.    Left eye: Left conjunctiva is not injected. No exudate. Pulmonary:     Effort: Pulmonary effort is normal.  Abdominal:     General: Abdomen is flat.     Palpations: Abdomen is soft. There is no hepatomegaly or mass.     Tenderness: There is no abdominal tenderness. There is no rebound.  Genitourinary:    Comments: Declined genital exam- asymptomatic Lymphadenopathy:     Cervical: No cervical adenopathy.     Upper Body:     Right upper body: No supraclavicular or axillary adenopathy.     Left upper body: No supraclavicular or axillary adenopathy.  Skin:    General: Skin is warm and dry.  Neurological:     Mental Status: He is alert and oriented to person, place, and time.  Assessment and Plan:  DERRIEN ANSCHUTZ is a 43 y.o. male presenting to the [facility] Department for STI screening  1. Screening for venereal disease (Primary) -declined blood work today   - Chlamydia/GC NAA, Confirmation   Patient does not have STI symptoms  Patient meets criteria for HepB screening? No. Ordered? not applicable Patient meets criteria for HepC screening? No. Ordered? not applicable Recommended condom use with all sex Discussed importance of condom use for STI prevention  Treat positive test results per standing order. Discussed time line  for State Lab results and that patient will be called with positive results and encouraged patient to call if he had not heard in 2 weeks Recommended repeat testing in 3 months with positive results. Recommended returning for continued or worsening symptoms.   Return if symptoms worsen or fail to improve, for STI screening.  Future Appointments  Date Time Provider Department Center  11/12/2023  3:45 PM Scoggins, Joice Lofts, NP AMA-AMA None    Lenice Llamas, [place]

## 2023-09-16 LAB — C. TRACHOMATIS NAA, CONFIRM: C. trachomatis NAA, Confirm: POSITIVE — AB

## 2023-09-16 LAB — CHLAMYDIA/GC NAA, CONFIRMATION
Chlamydia trachomatis, NAA: POSITIVE — AB
Neisseria gonorrhoeae, NAA: NEGATIVE

## 2023-09-17 ENCOUNTER — Ambulatory Visit: Payer: Self-pay

## 2023-09-17 DIAGNOSIS — A749 Chlamydial infection, unspecified: Secondary | ICD-10-CM

## 2023-09-17 MED ORDER — DOXYCYCLINE HYCLATE 100 MG PO TABS: 100.0000 mg | ORAL_TABLET | Freq: Two times a day (BID) | ORAL | Status: AC

## 2023-09-17 NOTE — Progress Notes (Signed)
 In nurse clinic for chlamydia treatment.   Treated per SO Dr Bohdan Bush with doxycycline 100 mg #14 with instructions to take one capsule twice daily for 7 days.   The patient was dispensed doxycycline 100 mg #14 today per SO Dr Bohdan Bush. I provided counseling today regarding the medication. We discussed the medication, the side effects and when to call clinic. Patient given Doxycycline info sheet. Patient given the opportunity to ask questions. Questions answered.   Chlamydia TOC due 3 mo (approx 12/2023) and reminder given. Rashan Rounsaville, RN

## 2023-11-12 ENCOUNTER — Ambulatory Visit: Payer: BC Managed Care – PPO | Admitting: Cardiology

## 2023-11-12 ENCOUNTER — Encounter: Payer: Self-pay | Admitting: Cardiology

## 2023-11-12 VITALS — BP 126/86 | HR 83 | Ht 72.0 in | Wt 215.0 lb

## 2023-11-12 DIAGNOSIS — Z131 Encounter for screening for diabetes mellitus: Secondary | ICD-10-CM

## 2023-11-12 DIAGNOSIS — Z1329 Encounter for screening for other suspected endocrine disorder: Secondary | ICD-10-CM | POA: Diagnosis not present

## 2023-11-12 DIAGNOSIS — E782 Mixed hyperlipidemia: Secondary | ICD-10-CM

## 2023-11-12 DIAGNOSIS — Z13220 Encounter for screening for lipoid disorders: Secondary | ICD-10-CM

## 2023-11-12 DIAGNOSIS — R7303 Prediabetes: Secondary | ICD-10-CM

## 2023-11-12 DIAGNOSIS — Z0130 Encounter for examination of blood pressure without abnormal findings: Secondary | ICD-10-CM

## 2023-11-12 NOTE — Progress Notes (Signed)
 Established Patient Office Visit  Subjective:  Patient ID: Tyrone Price, male    DOB: 1980/10/01  Age: 43 y.o. MRN: [id number]  Chief Complaint  Patient presents with   Follow-up    6 Months Follow Up    Patient in office for 6 month follow up, did not have blood work done. Patient doing well. No complaints today. Patient fasting, will get lab work, will call with results.  Patient not currently on any medications.    No other concerns at this time.   History reviewed. No pertinent past medical history.  Past Surgical History:  Procedure Laterality Date   INNER EAR SURGERY Left     Social History   Socioeconomic History   Marital status: Single    Spouse name: Not on file   Number of children: Not on file   Years of education: Not on file   Highest education level: Not on file  Occupational History   Not on file  Tobacco Use   Smoking status: Never   Smokeless tobacco: Never  Vaping Use   Vaping status: Never Used  Substance and Sexual Activity   Alcohol use: Yes   Drug use: Never   Sexual activity: Yes  Other Topics Concern   Not on file  Social History Narrative   Not on file   Social Drivers of Health   Financial Resource Strain: Not on file  Food Insecurity: Not on file  Transportation Needs: Not on file  Physical Activity: Not on file  Stress: Not on file  Social Connections: Not on file  Intimate Partner Violence: Not on file    Family History  Problem Relation Age of Onset   Hypertension Mother    Diabetes Father     Allergies  Allergen Reactions   Ciprofloxacin Swelling    Outpatient Medications Prior to Visit  Medication Sig   loratadine (CLARITIN) 10 MG tablet Take 10 mg by mouth daily.   No facility-administered medications prior to visit.    Review of Systems  Constitutional: Negative.   HENT: Negative.    Eyes: Negative.   Respiratory: Negative.  Negative for shortness of breath.   Cardiovascular: Negative.  Negative  for chest pain.  Gastrointestinal: Negative.  Negative for abdominal pain, constipation and diarrhea.  Genitourinary: Negative.   Musculoskeletal:  Negative for joint pain and myalgias.  Skin: Negative.   Neurological: Negative.  Negative for dizziness and headaches.  Endo/Heme/Allergies: Negative.   All other systems reviewed and are negative.      Objective:   BP 126/86   Pulse 83   Ht 6' (1.829 m)   Wt 215 lb (97.5 kg)   SpO2 96%   BMI 29.16 kg/m   Vitals:   11/12/23 1540  BP: 126/86  Pulse: 83  Height: 6' (1.829 m)  Weight: 215 lb (97.5 kg)  SpO2: 96%  BMI (Calculated): 29.15    Physical Exam Nursing note reviewed.  Constitutional:      Appearance: Normal appearance. He is normal weight.  HENT:     Head: Normocephalic and atraumatic.     Nose: Nose normal.     Mouth/Throat:     Mouth: Mucous membranes are moist.     Pharynx: Oropharynx is clear.   Eyes:     Extraocular Movements: Extraocular movements intact.     Conjunctiva/sclera: Conjunctivae normal.     Pupils: Pupils are equal, round, and reactive to light.    Cardiovascular:     Rate and  Rhythm: Normal rate and regular rhythm.     Pulses: Normal pulses.     Heart sounds: Normal heart sounds.  Pulmonary:     Effort: Pulmonary effort is normal.     Breath sounds: Normal breath sounds.  Abdominal:     General: Abdomen is flat. Bowel sounds are normal.     Palpations: Abdomen is soft.   Musculoskeletal:        General: Normal range of motion.     Cervical back: Normal range of motion.   Skin:    General: Skin is warm and dry.   Neurological:     General: No focal deficit present.     Mental Status: He is alert and oriented to person, place, and time.   Psychiatric:        Mood and Affect: Mood normal.        Behavior: Behavior normal.        Thought Content: Thought content normal.        Judgment: Judgment normal.      Results for orders placed or performed in visit on 11/12/23   Lipid Profile  Result Value Ref Range   Cholesterol, Total 211 (H) 100 - 199 mg/dL   Triglycerides 63 0 - 149 mg/dL   HDL 50 >13 mg/dL   VLDL Cholesterol Cal 11 5 - 40 mg/dL   LDL Chol Calc (NIH) 244 (H) 0 - 99 mg/dL   Chol/HDL Ratio 4.2 0.0 - 5.0 ratio  CMP14+EGFR  Result Value Ref Range   Glucose 103 (H) 70 - 99 mg/dL   BUN 11 6 - 24 mg/dL   Creatinine, Ser 0.10 0.76 - 1.27 mg/dL   eGFR 85 >27 OZ/DGU/4.40   BUN/Creatinine Ratio 10 9 - 20   Sodium 138 134 - 144 mmol/L   Potassium 4.5 3.5 - 5.2 mmol/L   Chloride 101 96 - 106 mmol/L   CO2 22 20 - 29 mmol/L   Calcium 9.7 8.7 - 10.2 mg/dL   Total Protein 7.6 6.0 - 8.5 g/dL   Albumin 4.8 4.1 - 5.1 g/dL   Globulin, Total 2.8 1.5 - 4.5 g/dL   Bilirubin Total 0.4 0.0 - 1.2 mg/dL   Alkaline Phosphatase 77 44 - 121 IU/L   AST 27 0 - 40 IU/L   ALT 26 0 - 44 IU/L  TSH  Result Value Ref Range   TSH 1.240 0.450 - 4.500 uIU/mL  Hemoglobin A1c  Result Value Ref Range   Hgb A1c MFr Bld 6.1 (H) 4.8 - 5.6 %   Est. average glucose Bld gHb Est-mCnc 128 mg/dL    Recent Results (from the past 2160 hours)  Chlamydia/GC NAA, Confirmation     Status: Abnormal   Collection Time: 09/11/23 11:23 AM   Specimen: Urine   UR  Result Value Ref Range   Chlamydia trachomatis, NAA Positive (A) Negative   Neisseria gonorrhoeae, NAA Negative Negative  C. trachomatis NAA, Confirm     Status: Abnormal   Collection Time: 09/11/23 11:23 AM   UR  Result Value Ref Range   C. trachomatis NAA, Confirm Positive (A) Negative  Lipid Profile     Status: Abnormal   Collection Time: 11/12/23  3:56 PM  Result Value Ref Range   Cholesterol, Total 211 (H) 100 - 199 mg/dL   Triglycerides 63 0 - 149 mg/dL   HDL 50 >34 mg/dL   VLDL Cholesterol Cal 11 5 - 40 mg/dL   LDL Chol Calc (NIH) 742 (H)  0 - 99 mg/dL   Chol/HDL Ratio 4.2 0.0 - 5.0 ratio    Comment:                                   T. Chol/HDL Ratio                                             Men  Women                                1/2 Avg.Risk  3.4    3.3                                   Avg.Risk  5.0    4.4                                2X Avg.Risk  9.6    7.1                                3X Avg.Risk 23.4   11.0   CMP14+EGFR     Status: Abnormal   Collection Time: 11/12/23  3:56 PM  Result Value Ref Range   Glucose 103 (H) 70 - 99 mg/dL   BUN 11 6 - 24 mg/dL   Creatinine, Ser 8.29 0.76 - 1.27 mg/dL   eGFR 85 >56 OZ/HYQ/6.57   BUN/Creatinine Ratio 10 9 - 20   Sodium 138 134 - 144 mmol/L   Potassium 4.5 3.5 - 5.2 mmol/L   Chloride 101 96 - 106 mmol/L   CO2 22 20 - 29 mmol/L   Calcium 9.7 8.7 - 10.2 mg/dL   Total Protein 7.6 6.0 - 8.5 g/dL   Albumin 4.8 4.1 - 5.1 g/dL   Globulin, Total 2.8 1.5 - 4.5 g/dL   Bilirubin Total 0.4 0.0 - 1.2 mg/dL   Alkaline Phosphatase 77 44 - 121 IU/L   AST 27 0 - 40 IU/L   ALT 26 0 - 44 IU/L  TSH     Status: None   Collection Time: 11/12/23  3:56 PM  Result Value Ref Range   TSH 1.240 0.450 - 4.500 uIU/mL  Hemoglobin A1c     Status: Abnormal   Collection Time: 11/12/23  3:56 PM  Result Value Ref Range   Hgb A1c MFr Bld 6.1 (H) 4.8 - 5.6 %    Comment:          Prediabetes: 5.7 - 6.4          Diabetes: >6.4          Glycemic control for adults with diabetes: <7.0    Est. average glucose Bld gHb Est-mCnc 128 mg/dL      Assessment & Plan:  Fasting lab work today, will call with results.  Problem List Items Addressed This Visit       Other   Prediabetes   Mixed hyperlipidemia   Other Visit Diagnoses       Diabetes mellitus screening    -  Primary   Relevant Orders  CMP14+EGFR (Completed)     Thyroid disorder screening       Relevant Orders   TSH (Completed)     Lipid screening       Relevant Orders   Lipid Profile (Completed)   Hemoglobin A1c (Completed)       Return in about 6 months (around 05/13/2024) for fasting labs prior.   Total time spent: 25 minutes  [employer], NP  11/12/2023   This document may have  been prepared by Dragon Voice Recognition software and as such may include unintentional dictation errors.

## 2023-11-13 ENCOUNTER — Ambulatory Visit: Payer: Self-pay | Admitting: Cardiology

## 2023-11-13 DIAGNOSIS — E782 Mixed hyperlipidemia: Secondary | ICD-10-CM | POA: Insufficient documentation

## 2023-11-13 DIAGNOSIS — R7303 Prediabetes: Secondary | ICD-10-CM | POA: Insufficient documentation

## 2023-11-13 LAB — CMP14+EGFR
ALT: 26 IU/L (ref 0–44)
AST: 27 IU/L (ref 0–40)
Albumin: 4.8 g/dL (ref 4.1–5.1)
Alkaline Phosphatase: 77 IU/L (ref 44–121)
BUN/Creatinine Ratio: 10 (ref 9–20)
BUN: 11 mg/dL (ref 6–24)
Bilirubin Total: 0.4 mg/dL (ref 0.0–1.2)
CO2: 22 mmol/L (ref 20–29)
Calcium: 9.7 mg/dL (ref 8.7–10.2)
Chloride: 101 mmol/L (ref 96–106)
Creatinine, Ser: 1.12 mg/dL (ref 0.76–1.27)
Globulin, Total: 2.8 g/dL (ref 1.5–4.5)
Glucose: 103 mg/dL — ABNORMAL HIGH (ref 70–99)
Potassium: 4.5 mmol/L (ref 3.5–5.2)
Sodium: 138 mmol/L (ref 134–144)
Total Protein: 7.6 g/dL (ref 6.0–8.5)
eGFR: 85 mL/min/{1.73_m2} (ref >59)

## 2023-11-13 LAB — LIPID PANEL
Chol/HDL Ratio: 4.2 ratio (ref 0.0–5.0)
Cholesterol, Total: 211 mg/dL — ABNORMAL HIGH (ref 100–199)
HDL: 50 mg/dL (ref >39)
LDL Chol Calc (NIH): 150 mg/dL — ABNORMAL HIGH (ref 0–99)
Triglycerides: 63 mg/dL (ref 0–149)
VLDL Cholesterol Cal: 11 mg/dL (ref 5–40)

## 2023-11-13 LAB — TSH: TSH: 1.24 u[IU]/mL (ref 0.450–4.500)

## 2023-11-13 LAB — HEMOGLOBIN A1C
Est. average glucose Bld gHb Est-mCnc: 128 mg/dL
Hgb A1c MFr Bld: 6.1 % — ABNORMAL HIGH (ref 4.8–5.6)

## 2024-05-17 ENCOUNTER — Ambulatory Visit: Admitting: Cardiology
